# Patient Record
Sex: Male | Born: 1965 | Race: White | Hispanic: No | Smoking: Never smoker
Health system: Southern US, Community
[De-identification: ages and names within clinical notes are randomized; demographics above are authoritative.]

---

## 2000-01-19 ENCOUNTER — Observation Stay (HOSPITAL_COMMUNITY): Admission: EM | Admit: 2000-01-19 | Discharge: 2000-01-20 | Payer: Self-pay | Admitting: Emergency Medicine

## 2000-01-19 ENCOUNTER — Encounter: Payer: Self-pay | Admitting: Emergency Medicine

## 2019-10-17 ENCOUNTER — Other Ambulatory Visit: Payer: Self-pay | Admitting: Physician Assistant

## 2019-10-17 ENCOUNTER — Ambulatory Visit
Admission: RE | Admit: 2019-10-17 | Discharge: 2019-10-17 | Disposition: A | Discharge status: Home / Self Care | Payer: Federal, State, Local not specified - PPO | Source: Ambulatory Visit | Attending: Physician Assistant | Admitting: Physician Assistant

## 2019-10-17 DIAGNOSIS — M959 Acquired deformity of musculoskeletal system, unspecified: Secondary | ICD-10-CM

## 2019-12-16 DIAGNOSIS — F5221 Male erectile disorder: Secondary | ICD-10-CM | POA: Diagnosis not present

## 2019-12-16 DIAGNOSIS — E291 Testicular hypofunction: Secondary | ICD-10-CM | POA: Diagnosis not present

## 2020-01-16 DIAGNOSIS — E291 Testicular hypofunction: Secondary | ICD-10-CM | POA: Diagnosis not present

## 2020-02-12 DIAGNOSIS — E291 Testicular hypofunction: Secondary | ICD-10-CM | POA: Diagnosis not present

## 2020-02-12 DIAGNOSIS — Z1329 Encounter for screening for other suspected endocrine disorder: Secondary | ICD-10-CM | POA: Diagnosis not present

## 2020-02-12 DIAGNOSIS — Z0189 Encounter for other specified special examinations: Secondary | ICD-10-CM | POA: Diagnosis not present

## 2020-02-12 DIAGNOSIS — F5221 Male erectile disorder: Secondary | ICD-10-CM | POA: Diagnosis not present

## 2020-03-24 DIAGNOSIS — E291 Testicular hypofunction: Secondary | ICD-10-CM | POA: Diagnosis not present

## 2020-04-09 DIAGNOSIS — E291 Testicular hypofunction: Secondary | ICD-10-CM | POA: Diagnosis not present

## 2020-05-06 DIAGNOSIS — F5221 Male erectile disorder: Secondary | ICD-10-CM | POA: Diagnosis not present

## 2020-05-06 DIAGNOSIS — E291 Testicular hypofunction: Secondary | ICD-10-CM | POA: Diagnosis not present

## 2020-05-10 DIAGNOSIS — E291 Testicular hypofunction: Secondary | ICD-10-CM | POA: Diagnosis not present

## 2020-05-25 DIAGNOSIS — E291 Testicular hypofunction: Secondary | ICD-10-CM | POA: Diagnosis not present

## 2020-06-17 DIAGNOSIS — E291 Testicular hypofunction: Secondary | ICD-10-CM | POA: Diagnosis not present

## 2020-07-01 DIAGNOSIS — E291 Testicular hypofunction: Secondary | ICD-10-CM | POA: Diagnosis not present

## 2020-10-15 DIAGNOSIS — E785 Hyperlipidemia, unspecified: Secondary | ICD-10-CM | POA: Diagnosis not present

## 2020-10-15 DIAGNOSIS — R947 Abnormal results of other endocrine function studies: Secondary | ICD-10-CM | POA: Diagnosis not present

## 2020-11-02 DIAGNOSIS — E785 Hyperlipidemia, unspecified: Secondary | ICD-10-CM | POA: Diagnosis not present

## 2020-11-02 DIAGNOSIS — R7989 Other specified abnormal findings of blood chemistry: Secondary | ICD-10-CM | POA: Diagnosis not present

## 2020-11-02 DIAGNOSIS — E559 Vitamin D deficiency, unspecified: Secondary | ICD-10-CM | POA: Diagnosis not present

## 2020-11-16 DIAGNOSIS — R946 Abnormal results of thyroid function studies: Secondary | ICD-10-CM | POA: Diagnosis not present

## 2020-11-16 DIAGNOSIS — R7989 Other specified abnormal findings of blood chemistry: Secondary | ICD-10-CM | POA: Diagnosis not present

## 2020-11-16 DIAGNOSIS — E291 Testicular hypofunction: Secondary | ICD-10-CM | POA: Diagnosis not present

## 2020-11-16 DIAGNOSIS — E119 Type 2 diabetes mellitus without complications: Secondary | ICD-10-CM | POA: Diagnosis not present

## 2020-11-16 DIAGNOSIS — Z13228 Encounter for screening for other metabolic disorders: Secondary | ICD-10-CM | POA: Diagnosis not present

## 2020-11-16 DIAGNOSIS — R947 Abnormal results of other endocrine function studies: Secondary | ICD-10-CM | POA: Diagnosis not present

## 2020-12-07 DIAGNOSIS — E291 Testicular hypofunction: Secondary | ICD-10-CM | POA: Diagnosis not present

## 2020-12-07 DIAGNOSIS — N529 Male erectile dysfunction, unspecified: Secondary | ICD-10-CM | POA: Diagnosis not present

## 2020-12-28 DIAGNOSIS — E291 Testicular hypofunction: Secondary | ICD-10-CM | POA: Diagnosis not present

## 2021-04-14 IMAGING — CR DG CHEST 2V
2 series · 2 of 2 positions shown · non-contrast
Comparison: No priors.

CLINICAL DATA: 54-year-old male with history of mid chest pain.

EXAM:
CHEST - 2 VIEW

[w chest pa]
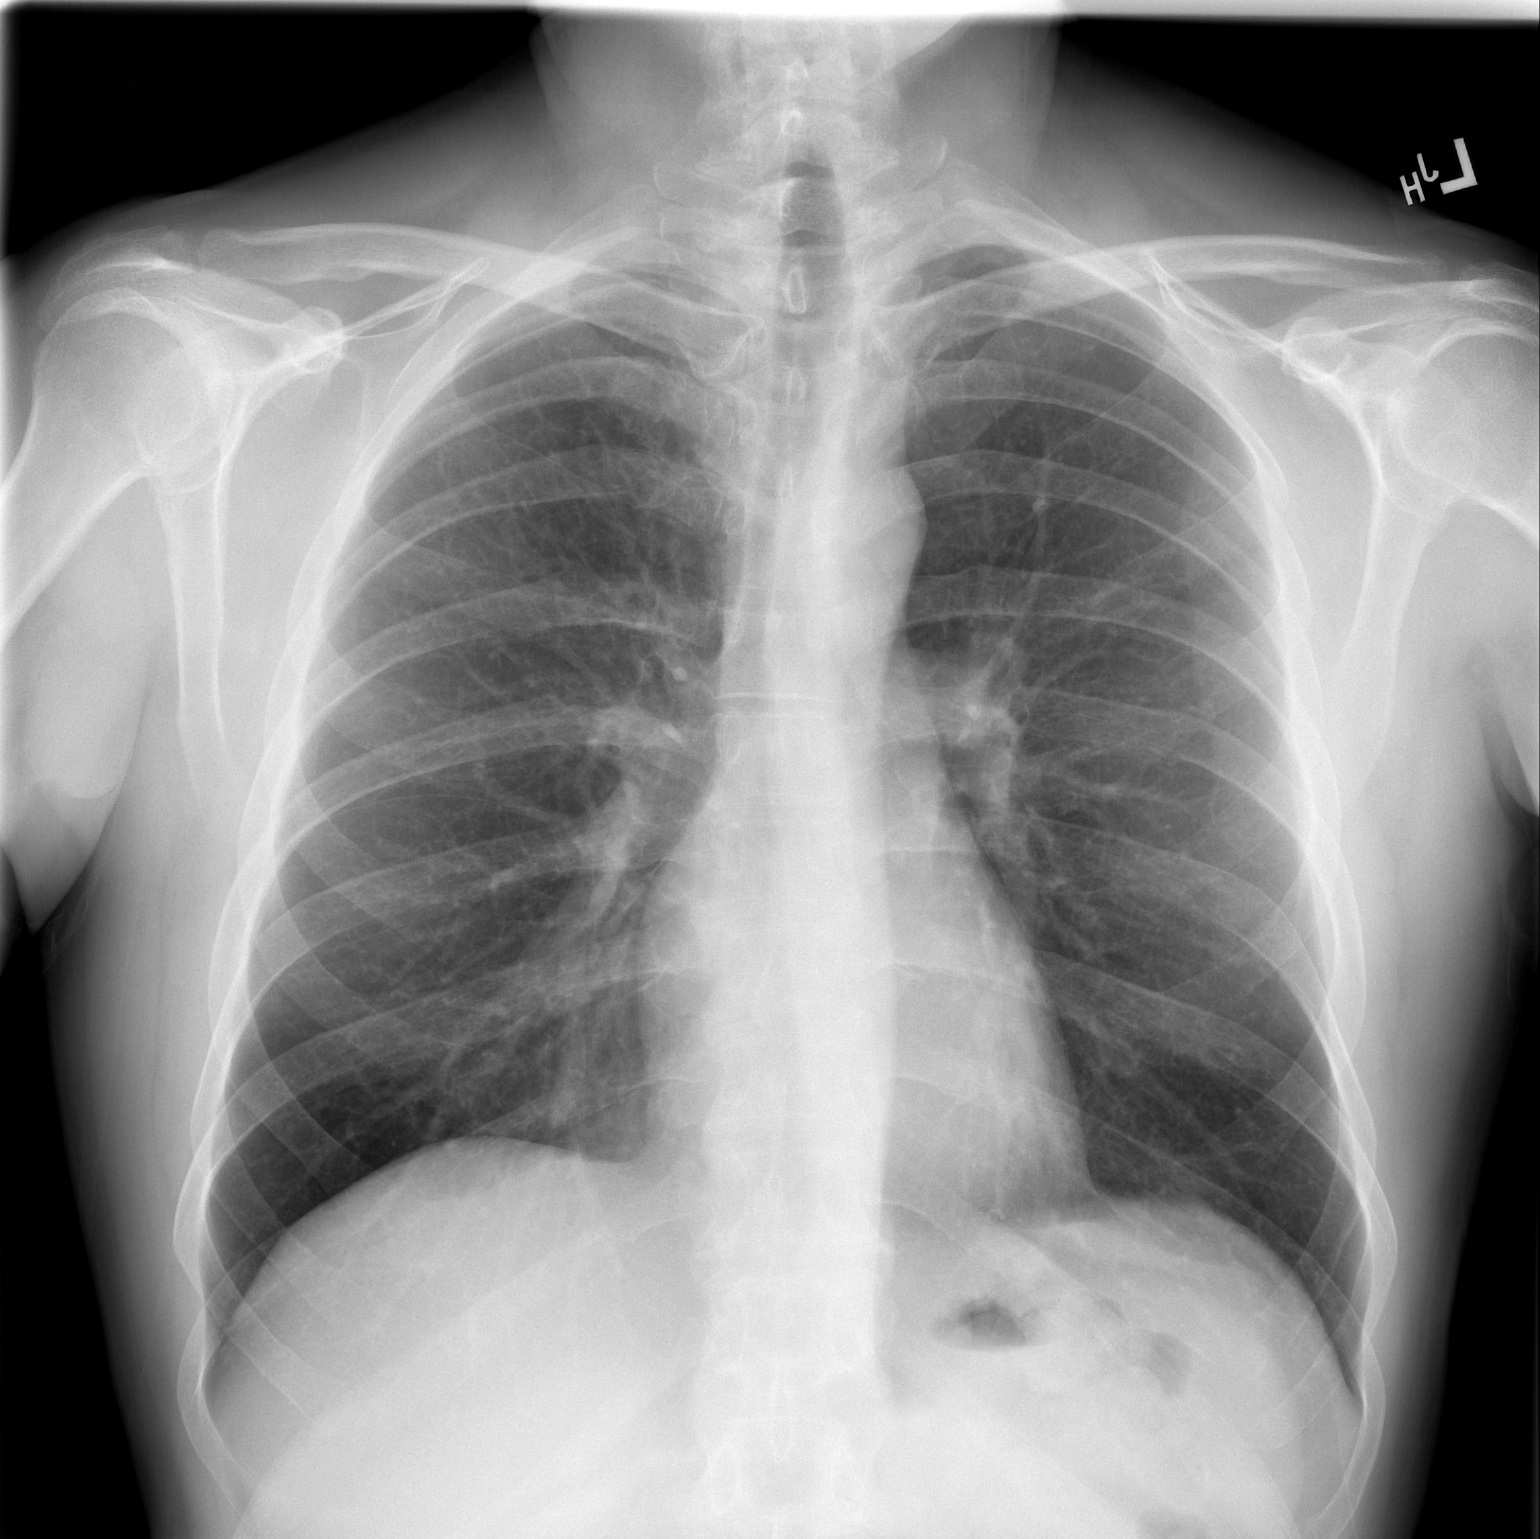

[w chest lat]
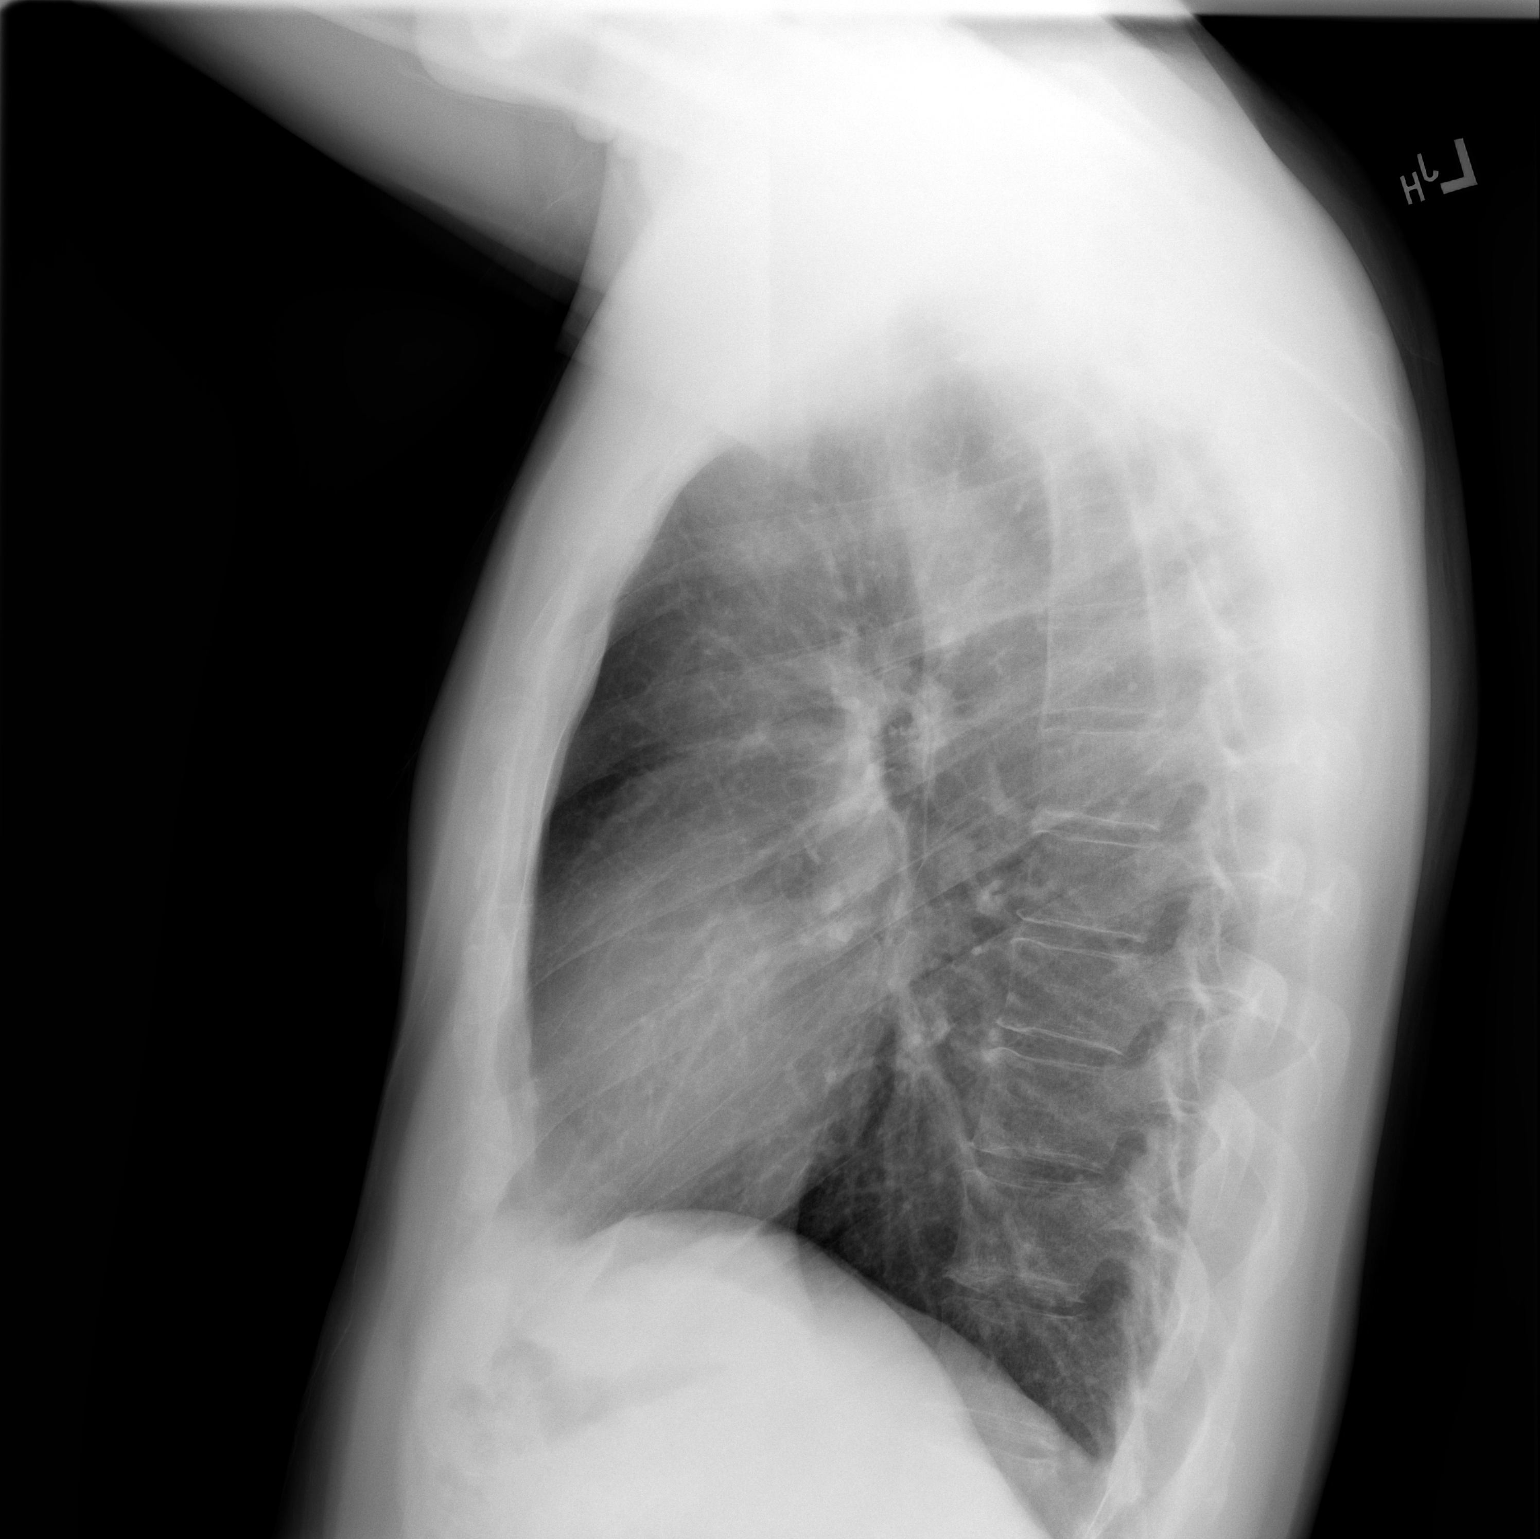

[2 of 2 positions shown; findings below may reference images not displayed]

FINDINGS: Lung volumes are normal. No consolidative airspace disease. No
pleural effusions. No pneumothorax. No pulmonary nodule or mass
noted. Pulmonary vasculature and the cardiomediastinal silhouette
are within normal limits.
IMPRESSION: No radiographic evidence of acute cardiopulmonary disease.

## 2021-05-17 DIAGNOSIS — Z1329 Encounter for screening for other suspected endocrine disorder: Secondary | ICD-10-CM | POA: Diagnosis not present

## 2021-06-10 DIAGNOSIS — Z13220 Encounter for screening for lipoid disorders: Secondary | ICD-10-CM | POA: Diagnosis not present

## 2021-07-29 DIAGNOSIS — Z0000 Encounter for general adult medical examination without abnormal findings: Secondary | ICD-10-CM | POA: Diagnosis not present

## 2021-07-29 DIAGNOSIS — Z125 Encounter for screening for malignant neoplasm of prostate: Secondary | ICD-10-CM | POA: Diagnosis not present

## 2021-07-29 DIAGNOSIS — F4310 Post-traumatic stress disorder, unspecified: Secondary | ICD-10-CM | POA: Diagnosis not present

## 2021-07-29 DIAGNOSIS — N529 Male erectile dysfunction, unspecified: Secondary | ICD-10-CM | POA: Diagnosis not present

## 2021-07-29 DIAGNOSIS — K219 Gastro-esophageal reflux disease without esophagitis: Secondary | ICD-10-CM | POA: Diagnosis not present

## 2021-07-29 DIAGNOSIS — E785 Hyperlipidemia, unspecified: Secondary | ICD-10-CM | POA: Diagnosis not present

## 2021-08-26 DIAGNOSIS — L03011 Cellulitis of right finger: Secondary | ICD-10-CM | POA: Diagnosis not present

## 2021-09-02 DIAGNOSIS — L03011 Cellulitis of right finger: Secondary | ICD-10-CM | POA: Diagnosis not present

## 2021-11-17 DIAGNOSIS — Z1211 Encounter for screening for malignant neoplasm of colon: Secondary | ICD-10-CM | POA: Diagnosis not present

## 2021-11-17 DIAGNOSIS — E785 Hyperlipidemia, unspecified: Secondary | ICD-10-CM | POA: Diagnosis not present

## 2022-04-01 ENCOUNTER — Emergency Department (HOSPITAL_COMMUNITY): Payer: Federal, State, Local not specified - PPO

## 2022-04-01 ENCOUNTER — Emergency Department (HOSPITAL_COMMUNITY)
Admission: EM | Admit: 2022-04-01 | Discharge: 2022-04-01 | Disposition: A | Discharge status: Home / Self Care | Payer: Federal, State, Local not specified - PPO | Attending: Emergency Medicine | Admitting: Emergency Medicine

## 2022-04-01 ENCOUNTER — Encounter (HOSPITAL_COMMUNITY): Payer: Self-pay | Admitting: *Deleted

## 2022-04-01 ENCOUNTER — Other Ambulatory Visit: Payer: Self-pay

## 2022-04-01 DIAGNOSIS — R9431 Abnormal electrocardiogram [ECG] [EKG]: Secondary | ICD-10-CM | POA: Diagnosis not present

## 2022-04-01 DIAGNOSIS — S0240FA Zygomatic fracture, left side, initial encounter for closed fracture: Secondary | ICD-10-CM | POA: Insufficient documentation

## 2022-04-01 DIAGNOSIS — S0219XB Other fracture of base of skull, initial encounter for open fracture: Secondary | ICD-10-CM | POA: Insufficient documentation

## 2022-04-01 DIAGNOSIS — S0281XB Fracture of other specified skull and facial bones, right side, initial encounter for open fracture: Secondary | ICD-10-CM | POA: Diagnosis not present

## 2022-04-01 DIAGNOSIS — S0101XA Laceration without foreign body of scalp, initial encounter: Secondary | ICD-10-CM | POA: Insufficient documentation

## 2022-04-01 DIAGNOSIS — S59902A Unspecified injury of left elbow, initial encounter: Secondary | ICD-10-CM | POA: Diagnosis not present

## 2022-04-01 DIAGNOSIS — R58 Hemorrhage, not elsewhere classified: Secondary | ICD-10-CM | POA: Diagnosis not present

## 2022-04-01 DIAGNOSIS — R609 Edema, unspecified: Secondary | ICD-10-CM | POA: Diagnosis not present

## 2022-04-01 DIAGNOSIS — W208XXA Other cause of strike by thrown, projected or falling object, initial encounter: Secondary | ICD-10-CM | POA: Diagnosis not present

## 2022-04-01 DIAGNOSIS — G4489 Other headache syndrome: Secondary | ICD-10-CM | POA: Diagnosis not present

## 2022-04-01 DIAGNOSIS — S199XXA Unspecified injury of neck, initial encounter: Secondary | ICD-10-CM | POA: Diagnosis not present

## 2022-04-01 DIAGNOSIS — S0990XA Unspecified injury of head, initial encounter: Secondary | ICD-10-CM | POA: Diagnosis not present

## 2022-04-01 MED ORDER — FENTANYL CITRATE PF 50 MCG/ML IJ SOSY
50.0000 ug | PREFILLED_SYRINGE | Freq: Once | INTRAMUSCULAR | Status: AC
  Administered 2022-04-01: 50 ug via INTRAMUSCULAR
  Filled 2022-04-01: qty 1

## 2022-04-01 MED ORDER — OXYCODONE-ACETAMINOPHEN 5-325 MG PO TABS: 1.0000 | ORAL_TABLET | Freq: Three times a day (TID) | ORAL | 0 refills | Status: AC | PRN

## 2022-04-01 MED ORDER — LIDOCAINE-EPINEPHRINE (PF) 2 %-1:200000 IJ SOLN
20.0000 mL | Freq: Once | INTRAMUSCULAR | Status: AC
  Administered 2022-04-01: 20 mL
  Filled 2022-04-01: qty 20

## 2022-04-01 MED ORDER — LIDOCAINE-EPINEPHRINE-TETRACAINE (LET) TOPICAL GEL
3.0000 mL | Freq: Once | TOPICAL | Status: AC
  Administered 2022-04-01: 3 mL via TOPICAL
  Filled 2022-04-01: qty 3

## 2022-04-01 MED ORDER — CEPHALEXIN 500 MG PO CAPS: 500.0000 mg | ORAL_CAPSULE | Freq: Four times a day (QID) | ORAL | 0 refills | Status: AC

## 2022-04-01 NOTE — Discharge Instructions (Addendum)
Please return to the ED for any new or worsening signs or symptoms Please follow-up with your PCP for further management You may also follow-up with the concussion specialist I referred you to.  Please call and make an appointment to be seen. Please keep bandage in place for 24 hours.  After that you can remove the bandage.  Please keep wound dry and clean.  Please be aware that localized signs of infection include redness, swelling or drainage from the laceration site Please read the attached guides concerning skull fractures, nonsutured laceration care, zygomatic fractures Please pick up antibiotic I placed you on.  You will take this 4 times daily for the next 7 days. Please pick up pain medication I put you on.  Please beware the pain medication will cause constipation.  I recommend purchasing MiraLAX to avoid this.

## 2022-04-01 NOTE — ED Provider Notes (Signed)
[facility] EMERGENCY DEPARTMENT Provider Note   CSN: [id number] Arrival date & time: 04/01/22  1020     History  Chief Complaint  Patient presents with   Head Laceration    Shawn Acosta is a 56 y.o. male with no documented medical history.  The patient presents to the ED for evaluation of head injury.  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  The patient states that he was on the ground when this occurred, denies falling from height.  Patient denies any loss of consciousness, nausea, vomiting or neck pain.  The patient is endorsing pain to the left side of his face/head.  The patient appears to have a laceration on the left side of his forehead with controlled bleeding.  The patient denies any blood thinners.   Head Laceration Associated symptoms include headaches.       Home Medications Prior to Admission medications   Medication Sig Start Date End Date Taking? Authorizing Provider  cephALEXin (KEFLEX) 500 MG capsule Take 1 capsule (500 mg total) by mouth 4 (four) times daily. 04/01/22  Yes Al Decant, PA-C  oxyCODONE-acetaminophen (PERCOCET/ROXICET) 5-325 MG tablet Take 1 tablet by mouth every 8 (eight) hours as needed for severe pain. 04/01/22  Yes Al Decant, PA-C      Allergies    Patient has no allergy information on record.    Review of Systems   Review of Systems  Gastrointestinal:  Negative for nausea and vomiting.  Musculoskeletal:  Negative for back pain and neck pain.  Skin:  Positive for wound.  Neurological:  Positive for headaches. Negative for dizziness, syncope, facial asymmetry, speech difficulty, weakness, light-headedness and numbness.  All other systems reviewed and are negative.   Physical Exam Updated Vital Signs BP 117/81 (BP Location: Right Arm)   Pulse 60   Temp 97.9 F (36.6 C) (Oral)   Resp 14   Ht 5'  10" (1.778 m)   Wt 81.6 kg   SpO2 100%   BMI 25.83 kg/m  Physical Exam Vitals and nursing note reviewed.  Constitutional:      General: He is not in acute distress.    Appearance: Normal appearance. He is not ill-appearing, toxic-appearing or diaphoretic.  HENT:     Head: Normocephalic and atraumatic.     Nose: Nose normal. No congestion.     Mouth/Throat:     Mouth: Mucous membranes are moist.     Pharynx: Oropharynx is clear.  Eyes:     Extraocular Movements: Extraocular movements intact.     Conjunctiva/sclera: Conjunctivae normal.     Pupils: Pupils are equal, round, and reactive to light.  Cardiovascular:     Rate and Rhythm: Normal rate and regular rhythm.  Pulmonary:     Effort: Pulmonary effort is normal.     Breath sounds: Normal breath sounds. No wheezing.  Abdominal:     General: Abdomen is flat. Bowel sounds are normal.     Palpations: Abdomen is soft.     Tenderness: There is no abdominal tenderness.  Musculoskeletal:        General: Normal range of motion.     Cervical back: Normal range of motion and neck supple.  Skin:    General: Skin is warm and dry.     Capillary Refill: Capillary refill takes less than 2 seconds.  Neurological:     General: No focal deficit present.     Mental Status: He is alert and oriented to person, place, and time.     GCS: GCS eye subscore is 4. GCS verbal subscore is 5. GCS motor subscore is 6.     Cranial Nerves: Cranial nerves 2-12 are intact. No cranial nerve deficit.     Sensory: Sensation is intact. No sensory deficit.     Motor: Motor function is intact. No weakness.     Coordination: Coordination is intact. Heel to [facility] Test normal.     ED Results / Procedures / Treatments   Labs (all labs ordered are listed, but only abnormal results are displayed) Labs Reviewed - No data to display  EKG EKG Interpretation  Date/Time:  Friday April 01 2022 10:29:50 EDT Ventricular Rate:  55 PR Interval:    QRS  Duration: 111 QT Interval:  440 QTC Calculation: 421 R Axis:   82 Text Interpretation: Normal sinus rhythm no acute ST/T changes No old tracing to compare Confirmed by Pricilla Loveless [phone]) on 04/01/2022 10:33:24 AM  Radiology DG Elbow 2 Views Left  Result Date: 04/01/2022 CLINICAL DATA:  Injury while cutting tree branches, knocked over landing on LEFT arm EXAM: LEFT ELBOW - 2 VIEW COMPARISON:  None FINDINGS: Osseous mineralization normal. Joint spaces preserved. No fracture, dislocation, or bone destruction. No joint effusion. IMPRESSION: Normal exam. Electronically Signed   By: Ulyses Southward M.D.   On: 04/01/2022 12:38   CT Head Wo Contrast  Result Date: 04/01/2022 CLINICAL DATA:  Neck trauma, head injury. EXAM: CT HEAD WITHOUT CONTRAST CT CERVICAL SPINE WITHOUT CONTRAST TECHNIQUE: Multidetector CT imaging of the head and cervical spine was performed following the standard protocol without intravenous contrast. Multiplanar CT image reconstructions of the cervical spine were also generated. RADIATION DOSE REDUCTION: This exam was performed according to the departmental dose-optimization program which includes automated exposure control, adjustment of the mA and/or kV according to patient size and/or use of iterative reconstruction technique. COMPARISON:  None Available. FINDINGS: CT HEAD FINDINGS Brain: Ventricles are normal in size and configuration. No mass, hemorrhage, edema or other evidence of acute parenchymal abnormality. No extra-axial hemorrhage. Vascular: No hyperdense vessel or unexpected calcification. Skull: Nondisplaced fracture within the LEFT frontoparietal bone extending into the upper aspects of the LEFT temporal bone. Additional nondisplaced fracture of the LEFT zygomatic arch. Sinuses/Orbits: No acute finding. Other: Extensive scalp edema/laceration overlying the LEFT frontal and temporal bones. CT CERVICAL SPINE FINDINGS Alignment: Normal alignment. Skull base and vertebrae: No  fracture line or displaced fracture fragment. Soft tissues and spinal canal: No prevertebral fluid or swelling. No visible canal hematoma. Disc levels: Mild degenerative spondylosis within the lower cervical spine, with associated mild disc space narrowings and mild osseous spurring. No more than mild central canal stenosis at any level. Upper chest: Negative. Other: None. IMPRESSION: 1. Nondisplaced skull fracture within the LEFT frontoparietal bone extending into the upper aspects of the LEFT temporal bone. Additional nondisplaced fracture of the LEFT zygomatic arch. 2. No acute intracranial abnormality. No intracranial hemorrhage or edema. 3. Extensive scalp edema/lacerations overlying the LEFT frontal and temporal bone fracture sites. 4. No fracture or subluxation within the cervical spine. Mild degenerative changes within the lower cervical spine. Electronically Signed   By: Bary Richard M.D.   On: 04/01/2022 12:10   CT Cervical Spine Wo Contrast  Result Date: 04/01/2022 CLINICAL DATA:  Neck trauma, head injury. EXAM: CT HEAD WITHOUT CONTRAST CT CERVICAL SPINE  WITHOUT CONTRAST TECHNIQUE: Multidetector CT imaging of the head and cervical spine was performed following the standard protocol without intravenous contrast. Multiplanar CT image reconstructions of the cervical spine were also generated. RADIATION DOSE REDUCTION: This exam was performed according to the departmental dose-optimization program which includes automated exposure control, adjustment of the mA and/or kV according to patient size and/or use of iterative reconstruction technique. COMPARISON:  None Available. FINDINGS: CT HEAD FINDINGS Brain: Ventricles are normal in size and configuration. No mass, hemorrhage, edema or other evidence of acute parenchymal abnormality. No extra-axial hemorrhage. Vascular: No hyperdense vessel or unexpected calcification. Skull: Nondisplaced fracture within the LEFT frontoparietal bone extending into the  upper aspects of the LEFT temporal bone. Additional nondisplaced fracture of the LEFT zygomatic arch. Sinuses/Orbits: No acute finding. Other: Extensive scalp edema/laceration overlying the LEFT frontal and temporal bones. CT CERVICAL SPINE FINDINGS Alignment: Normal alignment. Skull base and vertebrae: No fracture line or displaced fracture fragment. Soft tissues and spinal canal: No prevertebral fluid or swelling. No visible canal hematoma. Disc levels: Mild degenerative spondylosis within the lower cervical spine, with associated mild disc space narrowings and mild osseous spurring. No more than mild central canal stenosis at any level. Upper chest: Negative. Other: None. IMPRESSION: 1. Nondisplaced skull fracture within the LEFT frontoparietal bone extending into the upper aspects of the LEFT temporal bone. Additional nondisplaced fracture of the LEFT zygomatic arch. 2. No acute intracranial abnormality. No intracranial hemorrhage or edema. 3. Extensive scalp edema/lacerations overlying the LEFT frontal and temporal bone fracture sites. 4. No fracture or subluxation within the cervical spine. Mild degenerative changes within the lower cervical spine. Electronically Signed   By: Bary Richard M.D.   On: 04/01/2022 12:10    Procedures .Marland KitchenLaceration Repair  Date/Time: 04/01/2022 2:15 PM  Performed by: Al Decant, PA-C Authorized by: Al Decant, PA-C   Consent:    Consent obtained:  Verbal   Consent given by:  Patient   Risks, benefits, and alternatives were discussed: yes     Risks discussed:  Infection, need for additional repair, poor wound healing, poor cosmetic result, pain, nerve damage, vascular damage, tendon damage and retained foreign body   Alternatives discussed:  No treatment Universal protocol:    Patient identity confirmed:  Verbally with patient and arm band Anesthesia:    Anesthesia method:  Topical application and local infiltration   Topical anesthetic:  LET    Local anesthetic:  Lidocaine 2% WITH epi Laceration details:    Location:  Scalp   Scalp location:  L temporal   Length (cm):  7 Exploration:    Hemostasis achieved with:  Direct pressure, LET and epinephrine Treatment:    Area cleansed with:  Saline   Amount of cleaning:  Extensive   Irrigation solution:  Sterile saline   Irrigation method:  Pressure wash and syringe   Visualized foreign bodies/material removed: no   Skin repair:    Repair method:  Staples   Number of staples:  7 Approximation:    Approximation:  Close Repair type:    Repair type:  Simple Post-procedure details:    Dressing:  Bulky dressing   Procedure completion:  Tolerated well, no immediate complications    Medications Ordered in ED Medications  lidocaine-EPINEPHrine (XYLOCAINE W/EPI) 2 %-1:200000 (PF) injection 20 mL (has no administration in time range)  fentaNYL (SUBLIMAZE) injection 50 mcg (50 mcg Intramuscular Given 04/01/22 1121)  lidocaine-EPINEPHrine-tetracaine (LET) topical gel (3 mLs Topical Given 04/01/22 1313)    ED  Course/ Medical Decision Making/ A&P                           Medical Decision Making Amount and/or Complexity of Data Reviewed Radiology: ordered.  Risk Prescription drug management.   56 year old male presents to ED for evaluation.  Please see HPI for further details.  On exam the patient has a 7 cm laceration to the left side of his head.  Bleeding is controlled.  Patient neurological examination shows no focal neurodeficits.  The patient does complain of slight "tingling" to the left side of his face however denies any numbness.  Patient afebrile and nontachycardic.  Patient lung sounds clear bilaterally, not hypoxic.  Patient complaining of left elbow pain, has intact flexion and extension.  No overlying skin change or deformity noted.  Will x-ray at abundance of caution.  Due to mechanism of injury (large tree slamming into his head) we will proceed with CT imaging of head  and neck.  CT head shows nondisplaced skull fracture within the left frontoparietal bone extending into the upper aspect of the left temporal bone.  There is an additional nondisplaced fracture of the left zygomatic arch.  There is no intracranial head bleed, hemorrhaging.  CT cervical spine imaging does not show any fracture, subluxation or listhesis of the cervical spine.  Patient wound closed per procedure note.  Neurosurgery was consulted, NP Aundra Millet states that this patient can follow-up with his PCP as there is no neurosurgical intervention needing to be undertaken.  The patient will be started on 70 of Keflex 4 times daily, advised to follow-up with PCP.  Patient will be sent home with pain medication as well.  Return precautions were provided to the patient and he voiced understanding of these.  The patient had all of his questions answered to his satisfaction prior to discharge.  The patient is stable at this time for discharge home.  Final Clinical Impression(s) / ED Diagnoses Final diagnoses:  Laceration of scalp, initial encounter  Open fracture of temporal bone, initial encounter [facility])  Closed fracture of left zygomatic arch, initial encounter (HCC)    Rx / DC Orders ED Discharge Orders          Ordered    cephALEXin (KEFLEX) 500 MG capsule  4 times daily        04/01/22 1419    oxyCODONE-acetaminophen (PERCOCET/ROXICET) 5-325 MG tablet  Every 8 hours PRN        04/01/22 1419              Al Decant, PA-C 04/01/22 1421    Pricilla Loveless, MD 04/04/22 [phone]

## 2022-04-01 NOTE — ED Triage Notes (Signed)
Patient was helping to cut some limbs and the limb kicked back hitting him in the head. Denies loc . Patient arrived to ed LSB with c-collar. Moves all ext. X 4 , a/o, pupils 4 bilaterally and equal .

## 2022-04-04 ENCOUNTER — Ambulatory Visit (INDEPENDENT_AMBULATORY_CARE_PROVIDER_SITE_OTHER): Payer: Federal, State, Local not specified - PPO | Admitting: Sports Medicine

## 2022-04-04 VITALS — BP 120/80 | HR 61 | Ht 70.0 in | Wt 180.0 lb

## 2022-04-04 DIAGNOSIS — S0990XA Unspecified injury of head, initial encounter: Secondary | ICD-10-CM

## 2022-04-04 NOTE — Patient Instructions (Signed)
Good to see you   

## 2022-04-04 NOTE — Progress Notes (Signed)
Aleen Sells D.[facility] [address] [place] [address] Phone: [phone]  Assessment and Plan:     1. Traumatic injury of head, initial encounter - Acute, improving, initial sports medicine visit - Patient does not have a concussion based on HPI, physical exam, symptom severity score, special testing at today's visit - I agree with patient's referral to our clinic from the ED after  significant head trauma from a branch striking him in the skull and causing skull fractures at left frontoparietal bone, temporal bone, zygomatic arch.  It is possible that patient's skull fracture dissipated the brunt of the force allowing patient to not suffer a concussion - Patient is cleared to return to all mental and physical activity as tolerated - Handout given on concussions and educated that patient should follow-up with our clinic if concussion-like symptoms present   Date of injury was 04/01/2022. Original symptom severity scores were 5 and 11. The patient was counseled on the nature of the injury, typical course and potential options for further evaluation and treatment. Discussed the importance of compliance with recommendations. Patient stated understanding of this plan and willingness to comply.  Recommendations:  -  Complete mental and physical rest for 48 hours after concussive event - Recommend light aerobic activity while keeping symptoms less than 3/10 - Stop mental or physical activities that cause symptoms to worsen greater than 3/10, and wait 24 hours before attempting them again - Eliminate screen time as much as possible for first 48 hours after concussive event, then continue limited screen time (recommend less than 2 hours per day)   - Encouraged to RTC as a  Pertinent previous records reviewed include CT head 04/01/2022, CT C-spine 04/01/2022, ER note 04/01/2022   Time of visit 45 minutes, which included chart review, physical exam, treatment plan,  symptom severity score, VOMS, and tandem gait testing being performed, interpreted, and discussed with patient at today's visit.   Subjective:   I, Jerene Canny, am serving as a Neurosurgeon for Doctor Richardean Sale  Chief Complaint: concussion symptoms   HPI:   04/04/22 JUWUAN SEDITA is a 56 y.o. male with no documented medical history.  The patient presents to the ED for evaluation of head injury.  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  The patient states that he was on the ground when this occurred, denies falling from height.  Patient denies any loss of consciousness, nausea, vomiting or neck pain.  The patient is endorsing pain to the left side of his face/head.  The patient appears to have a laceration on the left side of his forehead with controlled bleeding.  The patient denies any blood thinners   Concussion HPI:  - Injury date: 04/01/2022   - Mechanism of injury: tree limb hit him on the head   - LOC: no   - Initial evaluation: Ed  - Previous head injuries/concussions: no    - Previous imaging: no    - Social history:just work    Hospitalization for head injury? No Diagnosed/treated for headache disorder or migraines? No Diagnosed with learning disability Elnita Maxwell? No Diagnosed with ADD/ADHD? No Diagnose with Depression, anxiety, or other Psychiatric Disorder? Yes PTSD    Current medications:  Current Outpatient Medications  Medication Sig Dispense Refill   cephALEXin (KEFLEX) 500 MG capsule Take 1 capsule (500 mg total) by mouth 4 (four)  times daily. 28 capsule 0   oxyCODONE-acetaminophen (PERCOCET/ROXICET) 5-325 MG tablet Take 1 tablet by mouth every 8 (eight) hours as needed for severe pain. 15 tablet 0   No current facility-administered medications for this visit.      Objective:     Vitals:   04/04/22 1256  BP: 120/80  Pulse: 61  SpO2: 96%  Weight: 180 lb  (81.6 kg)  Height: 5\' 10"  (1.778 m)      Body mass index is 25.83 kg/m.    Physical Exam:     General: Well-appearing, cooperative, sitting comfortably in no acute distress.  Psychiatric: Mood and affect are appropriate.     Today's Symptom Severity Score:  Scores: 0-6  Headache:0 "Pressure in head":0  Neck Pain:0  Nausea or vomiting:0  Dizziness:1  Blurred vision:0  Balance problems:1  Sensitivity to light:0  Sensitivity to noise:0  Feeling slowed down:3  Feeling like "in a fog":0  "Don't feel right":1  Difficulty concentrating:0  Difficulty remembering:0  Fatigue or low energy:2  Confusion:0  Drowsiness:0  More emotional:0  Irritability:3  Sadness:0  Nervous or Anxious:0  Trouble falling asleep:0   Total number of symptoms: 5/22  Symptom Severity index: 11/132  Worse with physical activity? Yes  Worse with mental activity? No Percent improved since injury: 75%    Full pain-free cervical PROM: yes     Tandem gait: - Forward, eyes open: 0 errors - Backward, eyes open: 0 errors - Forward, eyes closed: 1 errors - Backward, eyes closed: 2 errors  VOMS:   - Baseline symptoms: 0 - Horizontal Vestibular-Ocular Reflex: 0/10  - Vertical Vestibular-Ocular Reflex: 0/10  - Smooth pursuits: 0/10  - Horizontal Saccades:  0/10  - Vertical Saccades: 0/10  - Visual Motion Sensitivity Test:  0/10  - Convergence: Patient did not bring in glasses, so test not performed    Electronically signed by:  06-02-1976 D.[facility] 1:24 PM 04/04/22

## 2022-04-11 DIAGNOSIS — Z1211 Encounter for screening for malignant neoplasm of colon: Secondary | ICD-10-CM | POA: Diagnosis not present

## 2022-04-11 DIAGNOSIS — K648 Other hemorrhoids: Secondary | ICD-10-CM | POA: Diagnosis not present

## 2022-04-11 DIAGNOSIS — K5730 Diverticulosis of large intestine without perforation or abscess without bleeding: Secondary | ICD-10-CM | POA: Diagnosis not present

## 2022-04-11 DIAGNOSIS — D125 Benign neoplasm of sigmoid colon: Secondary | ICD-10-CM | POA: Diagnosis not present

## 2022-04-13 DIAGNOSIS — Z4802 Encounter for removal of sutures: Secondary | ICD-10-CM | POA: Diagnosis not present

## 2022-04-13 DIAGNOSIS — S0291XD Unspecified fracture of skull, subsequent encounter for fracture with routine healing: Secondary | ICD-10-CM | POA: Diagnosis not present

## 2022-04-13 DIAGNOSIS — S0990XD Unspecified injury of head, subsequent encounter: Secondary | ICD-10-CM | POA: Diagnosis not present

## 2022-09-07 DIAGNOSIS — Z125 Encounter for screening for malignant neoplasm of prostate: Secondary | ICD-10-CM | POA: Diagnosis not present

## 2022-09-07 DIAGNOSIS — Z1159 Encounter for screening for other viral diseases: Secondary | ICD-10-CM | POA: Diagnosis not present

## 2022-09-07 DIAGNOSIS — K219 Gastro-esophageal reflux disease without esophagitis: Secondary | ICD-10-CM | POA: Diagnosis not present

## 2022-09-07 DIAGNOSIS — F4310 Post-traumatic stress disorder, unspecified: Secondary | ICD-10-CM | POA: Diagnosis not present

## 2022-09-07 DIAGNOSIS — N529 Male erectile dysfunction, unspecified: Secondary | ICD-10-CM | POA: Diagnosis not present

## 2022-09-07 DIAGNOSIS — E785 Hyperlipidemia, unspecified: Secondary | ICD-10-CM | POA: Diagnosis not present

## 2022-09-07 DIAGNOSIS — Z0000 Encounter for general adult medical examination without abnormal findings: Secondary | ICD-10-CM | POA: Diagnosis not present

## 2023-03-29 DIAGNOSIS — K080 Exfoliation of teeth due to systemic causes: Secondary | ICD-10-CM | POA: Diagnosis not present

## 2023-09-13 DIAGNOSIS — Z0000 Encounter for general adult medical examination without abnormal findings: Secondary | ICD-10-CM | POA: Diagnosis not present

## 2023-09-13 DIAGNOSIS — E785 Hyperlipidemia, unspecified: Secondary | ICD-10-CM | POA: Diagnosis not present

## 2023-09-13 DIAGNOSIS — N529 Male erectile dysfunction, unspecified: Secondary | ICD-10-CM | POA: Diagnosis not present

## 2023-09-13 DIAGNOSIS — Z125 Encounter for screening for malignant neoplasm of prostate: Secondary | ICD-10-CM | POA: Diagnosis not present
